# Patient Record
Sex: Male | Born: 1970 | Race: White | Hispanic: Yes | Marital: Married | Smoking: Never smoker
Health system: Southern US, Community
[De-identification: ages and names within clinical notes are randomized; demographics above are authoritative.]

---

## 1998-06-10 ENCOUNTER — Emergency Department (HOSPITAL_COMMUNITY): Admission: EM | Admit: 1998-06-10 | Discharge: 1998-06-10 | Payer: Self-pay | Admitting: Emergency Medicine

## 2001-08-24 ENCOUNTER — Ambulatory Visit (HOSPITAL_COMMUNITY): Admission: RE | Admit: 2001-08-24 | Discharge: 2001-08-24 | Payer: Self-pay | Admitting: Internal Medicine

## 2001-08-24 ENCOUNTER — Encounter: Payer: Self-pay | Admitting: Internal Medicine

## 2002-04-16 ENCOUNTER — Encounter: Payer: Self-pay | Admitting: Internal Medicine

## 2002-04-16 ENCOUNTER — Ambulatory Visit (HOSPITAL_COMMUNITY): Admission: RE | Admit: 2002-04-16 | Discharge: 2002-04-16 | Payer: Self-pay | Admitting: Internal Medicine

## 2004-02-22 ENCOUNTER — Ambulatory Visit: Payer: Self-pay | Admitting: Internal Medicine

## 2004-02-24 ENCOUNTER — Ambulatory Visit: Payer: Self-pay | Admitting: *Deleted

## 2004-02-24 ENCOUNTER — Ambulatory Visit: Payer: Self-pay | Admitting: Internal Medicine

## 2004-11-05 ENCOUNTER — Ambulatory Visit: Payer: Self-pay | Admitting: Internal Medicine

## 2005-09-30 ENCOUNTER — Emergency Department (HOSPITAL_COMMUNITY): Admission: EM | Admit: 2005-09-30 | Discharge: 2005-09-30 | Payer: Self-pay | Admitting: Emergency Medicine

## 2016-04-26 ENCOUNTER — Encounter (HOSPITAL_COMMUNITY): Payer: Self-pay

## 2016-04-26 ENCOUNTER — Emergency Department (HOSPITAL_COMMUNITY)
Admission: EM | Admit: 2016-04-26 | Discharge: 2016-04-26 | Disposition: A | Discharge status: Home / Self Care | Payer: Self-pay | Attending: Emergency Medicine | Admitting: Emergency Medicine

## 2016-04-26 DIAGNOSIS — F419 Anxiety disorder, unspecified: Secondary | ICD-10-CM | POA: Insufficient documentation

## 2016-04-26 DIAGNOSIS — F410 Panic disorder [episodic paroxysmal anxiety] without agoraphobia: Secondary | ICD-10-CM | POA: Insufficient documentation
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
## 2016-04-26 NOTE — Discharge Instructions (Signed)
This was likely a panic attack. Please follow up with a primary care doctor or return to the ED if your symptoms worsen. Also return to the ED if you develop chest pain, shortness of breath, vision changes, or headache.

## 2017-08-29 ENCOUNTER — Emergency Department (HOSPITAL_BASED_OUTPATIENT_CLINIC_OR_DEPARTMENT_OTHER)
Admission: EM | Admit: 2017-08-29 | Discharge: 2017-08-29 | Disposition: A | Discharge status: Home / Self Care | Payer: No Typology Code available for payment source | Attending: Emergency Medicine | Admitting: Emergency Medicine

## 2017-08-29 ENCOUNTER — Emergency Department (HOSPITAL_BASED_OUTPATIENT_CLINIC_OR_DEPARTMENT_OTHER): Payer: No Typology Code available for payment source

## 2017-08-29 ENCOUNTER — Encounter (HOSPITAL_BASED_OUTPATIENT_CLINIC_OR_DEPARTMENT_OTHER): Payer: Self-pay

## 2017-08-29 ENCOUNTER — Other Ambulatory Visit: Payer: Self-pay

## 2017-08-29 DIAGNOSIS — S8001XA Contusion of right knee, initial encounter: Secondary | ICD-10-CM | POA: Insufficient documentation

## 2017-08-29 DIAGNOSIS — S161XXA Strain of muscle, fascia and tendon at neck level, initial encounter: Secondary | ICD-10-CM | POA: Insufficient documentation

## 2017-08-29 DIAGNOSIS — Y999 Unspecified external cause status: Secondary | ICD-10-CM | POA: Diagnosis not present

## 2017-08-29 DIAGNOSIS — Y92410 Unspecified street and highway as the place of occurrence of the external cause: Secondary | ICD-10-CM | POA: Insufficient documentation

## 2017-08-29 DIAGNOSIS — Y9389 Activity, other specified: Secondary | ICD-10-CM | POA: Diagnosis not present

## 2017-08-29 DIAGNOSIS — S1980XA Other specified injuries of unspecified part of neck, initial encounter: Secondary | ICD-10-CM | POA: Diagnosis present

## 2017-08-29 MED ORDER — IBUPROFEN 800 MG PO TABS: 800.0000 mg | ORAL_TABLET | Freq: Three times a day (TID) | ORAL | 0 refills | Status: AC | PRN

## 2017-08-29 MED ORDER — IBUPROFEN 800 MG PO TABS
800.0000 mg | ORAL_TABLET | Freq: Once | ORAL | Status: AC
  Administered 2017-08-29: 800 mg via ORAL
  Filled 2017-08-29: qty 1

## 2017-08-29 MED ORDER — CYCLOBENZAPRINE HCL 10 MG PO TABS: 10.0000 mg | ORAL_TABLET | Freq: Three times a day (TID) | ORAL | 0 refills | Status: AC | PRN
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
## 2017-08-29 NOTE — ED Notes (Signed)
Pt verbalizes understanding of d/c instructions and denies any further needs at this time. 
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
